# Patient Record
Sex: Male | Born: 1990 | Race: White | Hispanic: No | Marital: Single | Smoking: Current every day smoker
Health system: Southern US, Community
[De-identification: ages and names within clinical notes are randomized; demographics above are authoritative.]

## PROBLEM LIST (undated history)

## (undated) DIAGNOSIS — S56429A Laceration of extensor muscle, fascia and tendon of unspecified finger at forearm level, initial encounter: Secondary | ICD-10-CM

## (undated) DIAGNOSIS — L237 Allergic contact dermatitis due to plants, except food: Secondary | ICD-10-CM

## (undated) DIAGNOSIS — Z8614 Personal history of Methicillin resistant Staphylococcus aureus infection: Secondary | ICD-10-CM

## (undated) DIAGNOSIS — S61209A Unspecified open wound of unspecified finger without damage to nail, initial encounter: Secondary | ICD-10-CM

## (undated) HISTORY — PX: NO PAST SURGERIES: SHX2092

---

## 2012-05-20 DIAGNOSIS — Z8614 Personal history of Methicillin resistant Staphylococcus aureus infection: Secondary | ICD-10-CM

## 2012-05-20 HISTORY — DX: Personal history of Methicillin resistant Staphylococcus aureus infection: Z86.14

## 2013-11-01 ENCOUNTER — Encounter (HOSPITAL_COMMUNITY): Payer: Self-pay | Admitting: Emergency Medicine

## 2013-11-01 ENCOUNTER — Emergency Department (HOSPITAL_COMMUNITY)
Admission: EM | Admit: 2013-11-01 | Discharge: 2013-11-01 | Disposition: A | Discharge status: Home / Self Care | Payer: Self-pay | Attending: Emergency Medicine | Admitting: Emergency Medicine

## 2013-11-01 ENCOUNTER — Emergency Department (HOSPITAL_COMMUNITY): Payer: Self-pay

## 2013-11-01 DIAGNOSIS — F17200 Nicotine dependence, unspecified, uncomplicated: Secondary | ICD-10-CM | POA: Insufficient documentation

## 2013-11-01 DIAGNOSIS — S61012A Laceration without foreign body of left thumb without damage to nail, initial encounter: Secondary | ICD-10-CM

## 2013-11-01 DIAGNOSIS — S61209A Unspecified open wound of unspecified finger without damage to nail, initial encounter: Secondary | ICD-10-CM

## 2013-11-01 DIAGNOSIS — W261XXA Contact with sword or dagger, initial encounter: Secondary | ICD-10-CM

## 2013-11-01 DIAGNOSIS — Y9389 Activity, other specified: Secondary | ICD-10-CM | POA: Insufficient documentation

## 2013-11-01 DIAGNOSIS — Z880 Allergy status to penicillin: Secondary | ICD-10-CM | POA: Insufficient documentation

## 2013-11-01 DIAGNOSIS — W260XXA Contact with knife, initial encounter: Secondary | ICD-10-CM | POA: Insufficient documentation

## 2013-11-01 DIAGNOSIS — Z792 Long term (current) use of antibiotics: Secondary | ICD-10-CM | POA: Insufficient documentation

## 2013-11-01 DIAGNOSIS — Y929 Unspecified place or not applicable: Secondary | ICD-10-CM | POA: Insufficient documentation

## 2013-11-01 DIAGNOSIS — S56429A Laceration of extensor muscle, fascia and tendon of unspecified finger at forearm level, initial encounter: Secondary | ICD-10-CM

## 2013-11-01 DIAGNOSIS — Z23 Encounter for immunization: Secondary | ICD-10-CM | POA: Insufficient documentation

## 2013-11-01 HISTORY — DX: Laceration of extensor muscle, fascia and tendon of unspecified finger at forearm level, initial encounter: S56.429A

## 2013-11-01 HISTORY — DX: Unspecified open wound of unspecified finger without damage to nail, initial encounter: S61.209A

## 2013-11-01 MED ORDER — HYDROCODONE-ACETAMINOPHEN 5-325 MG PO TABS: 1.0000 | ORAL_TABLET | ORAL | Status: DC | PRN

## 2013-11-01 MED ORDER — OXYCODONE-ACETAMINOPHEN 5-325 MG PO TABS: 2.0000 | ORAL_TABLET | Freq: Once | ORAL | Status: DC

## 2013-11-01 MED ORDER — TETANUS-DIPHTH-ACELL PERTUSSIS 5-2.5-18.5 LF-MCG/0.5 IM SUSP
0.5000 mL | Freq: Once | INTRAMUSCULAR | Status: AC
Start: 2013-11-01 — End: 2013-11-01
  Administered 2013-11-01: 0.5 mL via INTRAMUSCULAR
  Filled 2013-11-01: qty 0.5

## 2013-11-01 MED ORDER — CLINDAMYCIN HCL 300 MG PO CAPS: 300.0000 mg | ORAL_CAPSULE | Freq: Three times a day (TID) | ORAL | Status: AC

## 2013-11-01 NOTE — Progress Notes (Signed)
Orthopedic Tech Progress Note Patient Details:  Jolene ProvostKasey Lerman 03/28/1991 [id number]  Ortho Devices Type of Ortho Device: Ace wrap;Thumb spica splint Ortho Device/Splint Location: lue Ortho Device/Splint Interventions: Application As ordered by PA Paulo FruitHannah Mutchersbaugh  Leonidus Rowand 11/01/2013, 8:59 PM

## 2013-11-01 NOTE — Discharge Instructions (Addendum)
Keep bandage on, clean and dry  Take your antibiotic medicine until gone (clindamycin) Follow-up as directed

## 2013-11-01 NOTE — ED Provider Notes (Signed)
CSN: [id number]     Arrival date & time 11/01/13  1814 History  This chart was scribed for non-physician practitioner, Dierdre ForthHannah Ryn Peine, PA-C working with Flint MelterElliott L Wentz, MD by Greggory StallionKayla Andersen, ED scribe. This patient was seen in room TR06C/TR06C and the patient's care was started at 7:25 PM.   Chief Complaint  Patient presents with  . Laceration    Laceration to the finger, the left thumb.  Bleeding is controlled.   The history is provided by the patient. No language interpreter was used.   HPI Comments: Kevin Gilmore is a 23 y.o. male who presents to the Emergency Department complaining of a laceration to his left thumb that occurred around 5:45 PM today. States he was making a fishing pole out of bamboo with a kitchen knife and accidentally cut his left thumb. Pt has moderate pain around the area that is worsened with certain movements. He has taken two hydrocodone pills prior to arrival with little relief. Pt is unsure of when his last tetanus was.   History reviewed. No pertinent past medical history. History reviewed. No pertinent past surgical history. History reviewed. No pertinent family history. History  Substance Use Topics  . Smoking status: Current Every Day Smoker -- 0.50 packs/day    Types: Cigarettes  . Smokeless tobacco: Never Used  . Alcohol Use: Yes     Comment: ocassionally    Review of Systems  Musculoskeletal: Positive for myalgias.  Skin: Positive for wound.  All other systems reviewed and are negative.  Allergies  Penicillins  Home Medications   Prior to Admission medications   Medication Sig Start Date End Date Taking? Authorizing Provider  clindamycin (CLEOCIN) 300 MG capsule Take 1 capsule (300 mg total) by mouth 3 (three) times daily. 11/01/13   Jodi Marbleavid A Thompson, MD  HYDROcodone-acetaminophen (NORCO) 5-325 MG per tablet Take 1-2 tablets by mouth every 4 (four) hours as needed. 11/01/13   Jodi Marbleavid A Thompson, MD   BP 116/63  Pulse 59  Temp(Src) 98.3 F  (36.8 C) (Oral)  Resp 18  SpO2 99%  Physical Exam  Nursing note and vitals reviewed. Constitutional: He is oriented to person, place, and time. He appears well-developed and well-nourished. No distress.  HENT:  Head: Normocephalic and atraumatic.  Eyes: Conjunctivae are normal. No scleral icterus.  Neck: Normal range of motion.  Cardiovascular: Normal rate, regular rhythm, normal heart sounds and intact distal pulses.   No murmur heard. Capillary refill < 3 sec  Pulmonary/Chest: Effort normal and breath sounds normal. No respiratory distress.  Musculoskeletal: Normal range of motion. He exhibits no edema.  ROM: Full flexion of left thumb. Extension of MCP and PIP but no extension of DIP - evidence of extensor tendon rupture  Neurological: He is alert and oriented to person, place, and time.  Sensation: intact Strength: 3/5 at MCP due to pain  Skin: Skin is warm and dry. He is not diaphoretic. No erythema.  2 cm deep laceration to the dorsum of left thumb with visible fascia  Psychiatric: He has a normal mood and affect.   ED Course  Procedures (including critical care time)  DIAGNOSTIC STUDIES: Oxygen Saturation is 98% on RA, normal by my interpretation.   COORDINATION OF CARE: 7:31 PM-Discussed treatment plan which includes updating tetanus and consulting hand surgery with pt at bedside and pt agreed to plan.   7:50 PM-Dr. Janee Mornhompson evaluated pt. Advised placing 2 sutures and have pt follow up in the office in 4 days.  LACERATION REPAIR  PROCEDURE NOTE The patient's identification was confirmed and consent was obtained. This procedure was performed by Newman NipAllen Benda, PA-Student at 8:05 PM. Authorized by: Dierdre ForthHannah Tahlia Deamer, PA-C Site: dorsum of left thumb Sterile procedures observed Anesthetic used (type and amt): 3 mL 2% lidocaine without epi Suture type/size: 5-0 Prolene Length: 2 cm # of Sutures: 2 Technique: simple interrupted Complexity: simple Antibx ointment  applied Tetanus ordered Site anesthetized, irrigated with NS, explored without evidence of foreign body, wound well approximated, site covered with dry, sterile dressing.  Patient tolerated procedure well without complications. Instructions for care discussed verbally and patient provided with additional written instructions for homecare and f/u.  Labs Review Labs Reviewed - No data to display  Imaging Review Dg Finger Thumb Left  11/01/2013   CLINICAL DATA:  Left thumb laceration.  EXAM: LEFT THUMB 2+V  COMPARISON:  None.  FINDINGS: Soft tissue defect near the base of the left thumb. No underlying bony abnormality. No fracture, subluxation or dislocation. No radiopaque foreign bodies. Joint spaces are maintained.  IMPRESSION: No acute bony abnormality.   Electronically Signed   By: Charlett NoseKevin  Dover M.D.   On: 11/01/2013 19:50     EKG Interpretation None      MDM   Final diagnoses:  Laceration of left thumb with tendon involvement   Kevin Gilmore presents with laceration to the left thumb.  Evidence of extensor tendon disruption on exam. Will consult hand and update tetanus shot.  Discussed with Dr. Janee Mornhompson of hand surgery.  He recommends skin closure of the wound and he will follow in the clinic and repair under anesthesia later date.  Patient without diabetes or reason to have delayed healing.  He does report that he is taking oral steroids for him poison oak.  Tdap booster given. Pressure irrigation performed. Laceration occurred < 8 hours prior to repair which was well tolerated. Pt has no co morbidities to effect normal wound healing.  Patient placed in a thumb spica for stabilization. He's to return to the emergency room for fevers, swelling or increased pain.  He is to take his antibiotics as directed.  He is to see Dr. Janee Mornhompson as directed.  BP 116/63  Pulse 59  Temp(Src) 98.3 F (36.8 C) (Oral)  Resp 18  SpO2 99%   I personally performed the services described in this  documentation, which was scribed in my presence. The recorded information has been reviewed and is accurate.  Dahlia ClientHannah Sandee Bernath, PA-C 11/01/13 2100

## 2013-11-01 NOTE — Consult Note (Signed)
ORTHOPAEDIC CONSULTATION HISTORY & PHYSICAL REQUESTING PHYSICIAN: Flint MelterElliott L Wentz, MD  Chief Complaint: Left thumb laceration  HPI: Kevin Gilmore is a 23 y.o. male who injured the dorsum of his left thumb with a kitchen knife, attempting to fashion bamboo into a fishing pole. His tetanus has been updated. 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  History reviewed. No pertinent past medical history. History reviewed. No pertinent past surgical history. History   Social History  . Marital Status: Single    Spouse Name: N/A    Number of Children: N/A  . Years of Education: N/A   Social History Main Topics  . Smoking status: Current Every Day Smoker -- 0.50 packs/day    Types: Cigarettes  . Smokeless tobacco: Never Used  . Alcohol Use: Yes     Comment: ocassionally  . Drug Use: No  . Sexual Activity: None   Other Topics Concern  . None   Social History Narrative  . None   History reviewed. No pertinent family history. Allergies  Allergen Reactions  . Penicillins    Prior to Admission medications   Medication Sig Start Date End Date Taking? Authorizing Provider  clindamycin (CLEOCIN) 300 MG capsule Take 1 capsule (300 mg total) by mouth 3 (three) times daily. 11/01/13   Jodi Marbleavid A Donnamae Muilenburg, MD  HYDROcodone-acetaminophen (NORCO) 5-325 MG per tablet Take 1-2 tablets by mouth every 4 (four) hours as needed. 11/01/13   Jodi Marbleavid A Aslyn Cottman, MD   Dg Finger Thumb Left  11/01/2013   CLINICAL DATA:  Left thumb laceration.  EXAM: LEFT THUMB 2+V  COMPARISON:  None.  FINDINGS: Soft tissue defect near the base of the left thumb. No underlying bony abnormality. No fracture, subluxation or dislocation. No radiopaque foreign bodies. Joint spaces are maintained.  IMPRESSION: No acute bony abnormality.   Electronically Signed   By: Charlett NoseKevin  Dover M.D.   On: 11/01/2013 19:50    Positive ROS: All other systems have been reviewed and were otherwise negative with the exception of those mentioned in the HPI and as  above.  Physical Exam: Vitals: Refer to EMR. Constitutional:  WD, WN, NAD HEENT:  NCAT, EOMI Neuro/Psych:  Alert & oriented to person, place, and time; appropriate mood & affect Lymphatic: No generalized extremity edema or lymphadenopathy Extremities / MSK:  The extremities are normal with respect to appearance, ranges of motion, joint stability, muscle strength/tone, sensation, & perfusion except as otherwise noted:  Left thumb with a mostly transverse laceration over the dorsal radial aspect of T1, with visible distal tendon stump, no visible proximal stump. He has no active extension of the IP joint, but he can extend the MP joint and he can lift the thumb from a flat position on the table into the air.  Assessment: Left thumb extensor tendon laceration  Plan: I discussed with the ER provider a plan that included water to closing sutures of the laceration of the skin. 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 The patient desires not to undergo general anesthesia, and this operation could be performed successfully with an axillary block. [facility]  Questions were invited and answered, and consent obtained. He'll be discharged with a short course of clindamycin.  Jerren Flinchbaugh A. Carnita Golob, MD      Orthopaedic & Hand Surgery [facility] [address] [place], [place]  [address] Office: [phone] Mobile: [phone]  

## 2013-11-01 NOTE — ED Notes (Signed)
Discharge instructions reviewed with pt. Pt verbalized understanding.   

## 2013-11-01 NOTE — ED Notes (Signed)
Laceration to the finger, the left thumb.  Bleeding is controlled.  The patient's pain is 6/10.  He was making a fishing pole out of bamboo and he accidentally cut his left thumb.

## 2013-11-02 ENCOUNTER — Other Ambulatory Visit: Payer: Self-pay | Admitting: Orthopedic Surgery

## 2013-11-02 ENCOUNTER — Encounter (HOSPITAL_BASED_OUTPATIENT_CLINIC_OR_DEPARTMENT_OTHER): Payer: Self-pay | Admitting: *Deleted

## 2013-11-02 NOTE — ED Provider Notes (Signed)
Medical screening examination/treatment/procedure(s) were performed by non-physician practitioner and as supervising physician I was immediately available for consultation/collaboration.   EKG Interpretation None       Maggy Wyble L Garon Melander, MD 11/02/13 1225 

## 2013-11-05 ENCOUNTER — Encounter (HOSPITAL_BASED_OUTPATIENT_CLINIC_OR_DEPARTMENT_OTHER): Payer: Self-pay | Admitting: Anesthesiology

## 2013-11-05 ENCOUNTER — Ambulatory Visit (HOSPITAL_BASED_OUTPATIENT_CLINIC_OR_DEPARTMENT_OTHER)
Admission: RE | Admit: 2013-11-05 | Discharge: 2013-11-05 | Disposition: A | Discharge status: Home / Self Care | Payer: Self-pay | Source: Ambulatory Visit | Attending: Orthopedic Surgery | Admitting: Orthopedic Surgery

## 2013-11-05 ENCOUNTER — Encounter (HOSPITAL_BASED_OUTPATIENT_CLINIC_OR_DEPARTMENT_OTHER)
Admission: RE | Disposition: A | Discharge status: Home / Self Care | Payer: Self-pay | Source: Ambulatory Visit | Attending: Orthopedic Surgery

## 2013-11-05 ENCOUNTER — Ambulatory Visit (HOSPITAL_BASED_OUTPATIENT_CLINIC_OR_DEPARTMENT_OTHER): Payer: Self-pay | Admitting: Anesthesiology

## 2013-11-05 DIAGNOSIS — W261XXA Contact with sword or dagger, initial encounter: Secondary | ICD-10-CM

## 2013-11-05 DIAGNOSIS — W260XXA Contact with knife, initial encounter: Secondary | ICD-10-CM | POA: Insufficient documentation

## 2013-11-05 DIAGNOSIS — S61409A Unspecified open wound of unspecified hand, initial encounter: Secondary | ICD-10-CM | POA: Insufficient documentation

## 2013-11-05 DIAGNOSIS — F17200 Nicotine dependence, unspecified, uncomplicated: Secondary | ICD-10-CM | POA: Insufficient documentation

## 2013-11-05 DIAGNOSIS — S66909A Unspecified injury of unspecified muscle, fascia and tendon at wrist and hand level, unspecified hand, initial encounter: Principal | ICD-10-CM

## 2013-11-05 HISTORY — DX: Allergic contact dermatitis due to plants, except food: L23.7

## 2013-11-05 HISTORY — PX: TENDON REPAIR: SHX5111

## 2013-11-05 HISTORY — DX: Laceration of extensor muscle, fascia and tendon of unspecified finger at forearm level, initial encounter: S56.429A

## 2013-11-05 HISTORY — DX: Unspecified open wound of unspecified finger without damage to nail, initial encounter: S61.209A

## 2013-11-05 HISTORY — DX: Personal history of Methicillin resistant Staphylococcus aureus infection: Z86.14

## 2013-11-05 LAB — POCT HEMOGLOBIN-HEMACUE: Hemoglobin: 17.7 g/dL — ABNORMAL HIGH (ref 13.0–17.0)

## 2013-11-05 SURGERY — TENDON REPAIR
Anesthesia: Regional | Site: Thumb | Laterality: Left

## 2013-11-05 MED ORDER — CHLORHEXIDINE GLUCONATE 4 % EX LIQD: 60.0000 mL | Freq: Once | CUTANEOUS | Status: DC

## 2013-11-05 MED ORDER — FENTANYL CITRATE 0.05 MG/ML IJ SOLN
INTRAMUSCULAR | Status: DC | PRN
  Administered 2013-11-05: 50 ug via INTRAVENOUS

## 2013-11-05 MED ORDER — LACTATED RINGERS IV SOLN
INTRAVENOUS | Status: DC
  Administered 2013-11-05: 14:00:00 via INTRAVENOUS

## 2013-11-05 MED ORDER — FENTANYL CITRATE 0.05 MG/ML IJ SOLN
INTRAMUSCULAR | Status: AC
  Filled 2013-11-05: qty 2

## 2013-11-05 MED ORDER — PROPOFOL INFUSION 10 MG/ML OPTIME
INTRAVENOUS | Status: DC | PRN
  Administered 2013-11-05: 200 ug/kg/min via INTRAVENOUS

## 2013-11-05 MED ORDER — OXYCODONE HCL 5 MG PO TABS
5.0000 mg | ORAL_TABLET | Freq: Once | ORAL | Status: AC | PRN
  Administered 2013-11-05: 5 mg via ORAL

## 2013-11-05 MED ORDER — OXYCODONE HCL 5 MG/5ML PO SOLN: 5.0000 mg | Freq: Once | ORAL | Status: AC | PRN

## 2013-11-05 MED ORDER — FENTANYL CITRATE 0.05 MG/ML IJ SOLN
50.0000 ug | INTRAMUSCULAR | Status: DC | PRN
  Administered 2013-11-05: 100 ug via INTRAVENOUS

## 2013-11-05 MED ORDER — FENTANYL CITRATE 0.05 MG/ML IJ SOLN
INTRAMUSCULAR | Status: AC
  Filled 2013-11-05: qty 4

## 2013-11-05 MED ORDER — LIDOCAINE HCL (CARDIAC) 20 MG/ML IV SOLN
INTRAVENOUS | Status: DC | PRN
  Administered 2013-11-05: 60 mg via INTRAVENOUS

## 2013-11-05 MED ORDER — HYDROCODONE-ACETAMINOPHEN 5-325 MG PO TABS: 1.0000 | ORAL_TABLET | ORAL | Status: AC | PRN

## 2013-11-05 MED ORDER — BUPIVACAINE-EPINEPHRINE (PF) 0.5% -1:200000 IJ SOLN
INTRAMUSCULAR | Status: AC
  Filled 2013-11-05: qty 30

## 2013-11-05 MED ORDER — HYDROMORPHONE HCL PF 1 MG/ML IJ SOLN
INTRAMUSCULAR | Status: AC
  Filled 2013-11-05: qty 1

## 2013-11-05 MED ORDER — HYDROMORPHONE HCL PF 1 MG/ML IJ SOLN
0.2500 mg | INTRAMUSCULAR | Status: DC | PRN
  Administered 2013-11-05 (×2): 0.5 mg via INTRAVENOUS

## 2013-11-05 MED ORDER — ONDANSETRON HCL 4 MG/2ML IJ SOLN
INTRAMUSCULAR | Status: DC | PRN
  Administered 2013-11-05: 4 mg via INTRAVENOUS

## 2013-11-05 MED ORDER — DEXAMETHASONE SODIUM PHOSPHATE 10 MG/ML IJ SOLN
INTRAMUSCULAR | Status: DC | PRN
  Administered 2013-11-05: 10 mg via INTRAVENOUS

## 2013-11-05 MED ORDER — MIDAZOLAM HCL 2 MG/2ML IJ SOLN
1.0000 mg | INTRAMUSCULAR | Status: DC | PRN
  Administered 2013-11-05: 2 mg via INTRAVENOUS

## 2013-11-05 MED ORDER — MEPERIDINE HCL 25 MG/ML IJ SOLN: 6.2500 mg | INTRAMUSCULAR | Status: DC | PRN

## 2013-11-05 MED ORDER — BUPIVACAINE HCL (PF) 0.5 % IJ SOLN
INTRAMUSCULAR | Status: AC
  Filled 2013-11-05: qty 30

## 2013-11-05 MED ORDER — MIDAZOLAM HCL 2 MG/ML PO SYRP: 12.0000 mg | ORAL_SOLUTION | Freq: Once | ORAL | Status: DC | PRN

## 2013-11-05 MED ORDER — PROPOFOL 10 MG/ML IV BOLUS
INTRAVENOUS | Status: DC | PRN
  Administered 2013-11-05: 200 mg via INTRAVENOUS

## 2013-11-05 MED ORDER — OXYCODONE HCL 5 MG PO TABS
ORAL_TABLET | ORAL | Status: AC
  Filled 2013-11-05: qty 1

## 2013-11-05 MED ORDER — MIDAZOLAM HCL 2 MG/2ML IJ SOLN
INTRAMUSCULAR | Status: AC
  Filled 2013-11-05: qty 2

## 2013-11-05 MED ORDER — ONDANSETRON HCL 4 MG/2ML IJ SOLN: 4.0000 mg | Freq: Once | INTRAMUSCULAR | Status: DC | PRN

## 2013-11-05 MED ORDER — CLINDAMYCIN PHOSPHATE 900 MG/50ML IV SOLN
900.0000 mg | INTRAVENOUS | Status: AC
  Administered 2013-11-05: 900 mg via INTRAVENOUS

## 2013-11-05 MED ORDER — BUPIVACAINE-EPINEPHRINE (PF) 0.5% -1:200000 IJ SOLN
INTRAMUSCULAR | Status: DC | PRN
  Administered 2013-11-05: 30 mL via PERINEURAL

## 2013-11-05 MED ORDER — CLINDAMYCIN PHOSPHATE 900 MG/50ML IV SOLN
INTRAVENOUS | Status: AC
  Filled 2013-11-05: qty 50

## 2013-11-05 SURGICAL SUPPLY — 68 items
BLADE MINI RND TIP GREEN BEAV (BLADE) IMPLANT
BLADE SURG 15 STRL LF DISP TIS (BLADE) ×1 IMPLANT
BLADE SURG 15 STRL SS (BLADE) ×2
BNDG COHESIVE 3X5 TAN STRL LF (GAUZE/BANDAGES/DRESSINGS) ×15 IMPLANT
BNDG ESMARK 4X9 LF (GAUZE/BANDAGES/DRESSINGS) ×3 IMPLANT
BNDG GAUZE ELAST 4 BULKY (GAUZE/BANDAGES/DRESSINGS) ×6 IMPLANT
CHLORAPREP W/TINT 26ML (MISCELLANEOUS) ×3 IMPLANT
CORDS BIPOLAR (ELECTRODE) ×3 IMPLANT
COVER MAYO STAND STRL (DRAPES) ×3 IMPLANT
COVER TABLE BACK 60X90 (DRAPES) ×3 IMPLANT
CUFF TOURNIQUET SINGLE 18IN (TOURNIQUET CUFF) ×3 IMPLANT
DECANTER SPIKE VIAL GLASS SM (MISCELLANEOUS) IMPLANT
DRAPE EXTREMITY T 121X128X90 (DRAPE) ×3 IMPLANT
DRAPE SURG 17X23 STRL (DRAPES) ×3 IMPLANT
DRSG EMULSION OIL 3X3 NADH (GAUZE/BANDAGES/DRESSINGS) ×3 IMPLANT
ELECT REM PT RETURN 9FT ADLT (ELECTROSURGICAL)
ELECTRODE REM PT RTRN 9FT ADLT (ELECTROSURGICAL) IMPLANT
GAUZE SPONGE 4X4 12PLY STRL (GAUZE/BANDAGES/DRESSINGS) ×3 IMPLANT
GLOVE BIO SURGEON STRL SZ7 (GLOVE) ×3 IMPLANT
GLOVE BIO SURGEON STRL SZ7.5 (GLOVE) ×3 IMPLANT
GLOVE BIOGEL M 7.0 STRL (GLOVE) ×3 IMPLANT
GLOVE BIOGEL PI IND STRL 7.5 (GLOVE) ×1 IMPLANT
GLOVE BIOGEL PI IND STRL 8 (GLOVE) ×1 IMPLANT
GLOVE BIOGEL PI INDICATOR 7.5 (GLOVE) ×2
GLOVE BIOGEL PI INDICATOR 8 (GLOVE) ×2
GLOVE ECLIPSE 6.5 STRL STRAW (GLOVE) ×3 IMPLANT
GOWN STRL REUS W/ TWL LRG LVL3 (GOWN DISPOSABLE) ×2 IMPLANT
GOWN STRL REUS W/TWL LRG LVL3 (GOWN DISPOSABLE) ×4
LOOP VESSEL MAXI BLUE (MISCELLANEOUS) IMPLANT
LOOP VESSEL MINI RED (MISCELLANEOUS) IMPLANT
NEEDLE HYPO 25X1 1.5 SAFETY (NEEDLE) IMPLANT
NS IRRIG 1000ML POUR BTL (IV SOLUTION) ×3 IMPLANT
PACK BASIN DAY SURGERY FS (CUSTOM PROCEDURE TRAY) ×3 IMPLANT
PADDING CAST ABS 3INX4YD NS (CAST SUPPLIES)
PADDING CAST ABS 4INX4YD NS (CAST SUPPLIES) ×2
PADDING CAST ABS COTTON 3X4 (CAST SUPPLIES) IMPLANT
PADDING CAST ABS COTTON 4X4 ST (CAST SUPPLIES) ×1 IMPLANT
PENCIL BUTTON HOLSTER BLD 10FT (ELECTRODE) IMPLANT
RUBBERBAND STERILE (MISCELLANEOUS) IMPLANT
SLEEVE SCD COMPRESS KNEE MED (MISCELLANEOUS) IMPLANT
SLING ARM LRG ADULT FOAM STRAP (SOFTGOODS) IMPLANT
SPEAR EYE SURG WECK-CEL (MISCELLANEOUS) ×3 IMPLANT
SPLINT FAST PLASTER 5X30 (CAST SUPPLIES)
SPLINT PLASTER CAST FAST 5X30 (CAST SUPPLIES) IMPLANT
SPLINT PLASTER CAST XFAST 3X15 (CAST SUPPLIES) IMPLANT
SPLINT PLASTER XTRA FASTSET 3X (CAST SUPPLIES)
STOCKINETTE 4X48 STRL (DRAPES) ×3 IMPLANT
SUT ETHIBOND 3-0 V-5 (SUTURE) ×3 IMPLANT
SUT ETHILON 8 0 BV130 4 (SUTURE) IMPLANT
SUT ETHILON 9 0 V 100.4 (SUTURE) ×3 IMPLANT
SUT FIBERWIRE 2-0 18 17.9 3/8 (SUTURE)
SUT MERSILENE 4 0 P 3 (SUTURE) IMPLANT
SUT NYLON 9 0 VRM6 (SUTURE) IMPLANT
SUT PROLENE 6 0 P 1 18 (SUTURE) ×3 IMPLANT
SUT SILK 4 0 PS 2 (SUTURE) IMPLANT
SUT STEEL 4 (SUTURE) ×3 IMPLANT
SUT SUPRAMID 3-0 (SUTURE) ×6 IMPLANT
SUT SUPRAMID 4-0 (SUTURE) IMPLANT
SUT VICRYL 3-0 CR8 SH (SUTURE) IMPLANT
SUT VICRYL RAPIDE 4-0 (SUTURE) IMPLANT
SUT VICRYL RAPIDE 4/0 PS 2 (SUTURE) ×3 IMPLANT
SUTURE FIBERWR 2-0 18 17.9 3/8 (SUTURE) IMPLANT
SYR BULB 3OZ (MISCELLANEOUS) ×3 IMPLANT
SYRINGE 10CC LL (SYRINGE) IMPLANT
TOWEL OR 17X24 6PK STRL BLUE (TOWEL DISPOSABLE) ×3 IMPLANT
TUBE CONNECTING 20'X1/4 (TUBING)
TUBE CONNECTING 20X1/4 (TUBING) IMPLANT
UNDERPAD 30X30 INCONTINENT (UNDERPADS AND DIAPERS) ×3 IMPLANT

## 2013-11-05 NOTE — Anesthesia Postprocedure Evaluation (Signed)
Anesthesia Post Note  Patient: Kevin Gilmore  Procedure(s) Performed: Procedure(s) (LRB): LEFT THUMB TENDON REPAIR (Left)  Anesthesia type: general  Patient location: PACU  Post pain: Pain level controlled  Post assessment: Patient's Cardiovascular Status Stable  Last Vitals:  Filed Vitals:   11/05/13 1615  BP: 110/59  Pulse: 68  Temp:   Resp: 18    Post vital signs: Reviewed and stable  Level of consciousness: sedated  Complications: No apparent anesthesia complications

## 2013-11-05 NOTE — Transfer of Care (Signed)
Immediate Anesthesia Transfer of Care Note  Patient: Kevin ProvostKasey Chipps  Procedure(s) Performed: Procedure(s) with comments: LEFT THUMB TENDON REPAIR (Left) - ANESTHESIA: GENERAL/BLOCK  Patient Location: PACU  Anesthesia Type:General  Level of Consciousness: awake and sedated  Airway & Oxygen Therapy: Patient Spontanous Breathing and Patient connected to face mask oxygen  Post-op Assessment: Report given to PACU RN and Post -op Vital signs reviewed and stable  Post vital signs: Reviewed and stable  Complications: No apparent anesthesia complications

## 2013-11-05 NOTE — Progress Notes (Signed)
Assisted Dr. Ossey with left, ultrasound guided, supraclavicular block. Side rails up, monitors on throughout procedure. See vital signs in flow sheet. Tolerated Procedure well. 

## 2013-11-05 NOTE — Anesthesia Procedure Notes (Addendum)
Anesthesia Regional Block:  Supraclavicular block  Pre-Anesthetic Checklist: ,, timeout performed, Correct Patient, Correct Site, Correct Laterality, Correct Procedure, Correct Position, site marked, Risks and benefits discussed,  Surgical consent,  Pre-op evaluation,  At surgeon's request and post-op pain management  Laterality: Left  Prep: chloraprep       Needles:  Injection technique: Single-shot  Needle Type: Echogenic Stimulator Needle     Needle Length: 9cm 9 cm Needle Gauge: 21 and 21 G    Additional Needles:  Procedures: ultrasound guided (picture in chart) and nerve stimulator Supraclavicular block  Nerve Stimulator or Paresthesia:  Response: 0.4 mA,   Additional Responses:   Narrative:  Start time: 11/05/2013 1:45 PM End time: 11/05/2013 2:00 PM Injection made incrementally with aspirations every 5 mL.  Performed by: Personally  Anesthesiologist: Arta BruceKevin Ossey MD  Additional Notes: Monitors applied. Patient sedated. Sterile prep and drape,hand hygiene and sterile gloves were used. Relevant anatomy identified.Needle position confirmed.Local anesthetic injected incrementally after negative aspiration. Local anesthetic spread visualized around nerve(s). Vascular puncture avoided. No complications. Image printed for medical record.The patient tolerated the procedure well.        Procedure Name: LMA Insertion Performed by: York GricePEARSON, DONNA W Pre-anesthesia Checklist: Patient identified, Timeout performed, Emergency Drugs available, Suction available and Patient being monitored    Procedure Name: LMA Insertion Performed by: York GricePEARSON, DONNA W Pre-anesthesia Checklist: Patient identified, Timeout performed, Emergency Drugs available, Suction available and Patient being monitored Patient Re-evaluated:Patient Re-evaluated prior to inductionOxygen Delivery Method: Circle system utilized Preoxygenation: Pre-oxygenation with 100% oxygen Intubation Type: IV  induction Ventilation: Mask ventilation without difficulty LMA: LMA inserted LMA Size: 4.0 Tube type: Oral Number of attempts: 1 Placement Confirmation: breath sounds checked- equal and bilateral and positive ETCO2 Tube secured with: Tape Dental Injury: Teeth and Oropharynx as per pre-operative assessment

## 2013-11-05 NOTE — H&P (View-Only) (Signed)
ORTHOPAEDIC CONSULTATION HISTORY & PHYSICAL REQUESTING PHYSICIAN: Flint MelterElliott L Wentz, MD  Chief Complaint: Left thumb laceration  HPI: Kevin Gilmore is a 23 y.o. male who injured the dorsum of his left thumb with a kitchen knife, attempting to fashion bamboo into a fishing pole. His tetanus has been updated. 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  History reviewed. No pertinent past medical history. History reviewed. No pertinent past surgical history. History   Social History  . Marital Status: Single    Spouse Name: N/A    Number of Children: N/A  . Years of Education: N/A   Social History Main Topics  . Smoking status: Current Every Day Smoker -- 0.50 packs/day    Types: Cigarettes  . Smokeless tobacco: Never Used  . Alcohol Use: Yes     Comment: ocassionally  . Drug Use: No  . Sexual Activity: None   Other Topics Concern  . None   Social History Narrative  . None   History reviewed. No pertinent family history. Allergies  Allergen Reactions  . Penicillins    Prior to Admission medications   Medication Sig Start Date End Date Taking? Authorizing Provider  clindamycin (CLEOCIN) 300 MG capsule Take 1 capsule (300 mg total) by mouth 3 (three) times daily. 11/01/13   Jodi Marbleavid A Nomi Rudnicki, MD  HYDROcodone-acetaminophen (NORCO) 5-325 MG per tablet Take 1-2 tablets by mouth every 4 (four) hours as needed. 11/01/13   Jodi Marbleavid A Anjeli Casad, MD   Dg Finger Thumb Left  11/01/2013   CLINICAL DATA:  Left thumb laceration.  EXAM: LEFT THUMB 2+V  COMPARISON:  None.  FINDINGS: Soft tissue defect near the base of the left thumb. No underlying bony abnormality. No fracture, subluxation or dislocation. No radiopaque foreign bodies. Joint spaces are maintained.  IMPRESSION: No acute bony abnormality.   Electronically Signed   By: Charlett NoseKevin  Dover M.D.   On: 11/01/2013 19:50    Positive ROS: All other systems have been reviewed and were otherwise negative with the exception of those mentioned in the HPI and as  above.  Physical Exam: Vitals: Refer to EMR. Constitutional:  WD, WN, NAD HEENT:  NCAT, EOMI Neuro/Psych:  Alert & oriented to person, place, and time; appropriate mood & affect Lymphatic: No generalized extremity edema or lymphadenopathy Extremities / MSK:  The extremities are normal with respect to appearance, ranges of motion, joint stability, muscle strength/tone, sensation, & perfusion except as otherwise noted:  Left thumb with a mostly transverse laceration over the dorsal radial aspect of T1, with visible distal tendon stump, no visible proximal stump. He has no active extension of the IP joint, but he can extend the MP joint and he can lift the thumb from a flat position on the table into the air.  Assessment: Left thumb extensor tendon laceration  Plan: I discussed with the ER provider a plan that included water to closing sutures of the laceration of the skin. 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 The patient desires not to undergo general anesthesia, and this operation could be performed successfully with an axillary block. [facility]  Questions were invited and answered, and consent obtained. He'll be discharged with a short course of clindamycin.  Kevin Astersavid A. Janee Mornhompson, MD      Orthopaedic & Hand Surgery [facility] [address] [place], [place]  [id number] Office: [phone] Mobile: [phone]

## 2013-11-05 NOTE — Op Note (Signed)
11/05/2013  2:25 PM  PATIENT:  Kevin Gilmore  [age] y.o. male  PRE-OPERATIVE DIAGNOSIS:  Left thumb extensor tendon laceration, zone 2  POST-OPERATIVE DIAGNOSIS:  Same  PROCEDURE:  Left thumb extensor tendon repair, zone 2  SURGEON: Cliffton Astersavid A. Janee Mornhompson, MD  PHYSICIAN ASSISTANT: None  ANESTHESIA:  general  SPECIMENS:  None  DRAINS:   None  PREOPERATIVE INDICATIONS:  Kevin Gilmore is a  23 y.o. male with left thumb extensor tendon laceration in zone 2  The risks benefits and alternatives were discussed with the patient preoperatively including but not limited to the risks of infection, bleeding, nerve injury, cardiopulmonary complications, the need for revision surgery, among others, and the patient verbalized understanding and consented to proceed.  OPERATIVE IMPLANTS: None  OPERATIVE PROCEDURE:  After receiving prophylactic antibiotics and a regional block, the patient was escorted to the operative theatre and placed in a supine position. General anesthesia was administered at the direction of the attending anesthesiologist Dr. Michelle Piperssey.   A surgical "time-out" was performed during which the planned procedure, proposed operative site, and the correct patient identity were compared to the operative consent and agreement confirmed by the circulating nurse according to current facility policy.  Following application of a tourniquet to the operative extremity, the exposed skin was pre-scrub with Hibiclens scrub brush before being formally prepped with Chloraprep and draped in the usual sterile fashion.  The limb was exsanguinated with an Esmarch bandage and the tourniquet inflated to approximately 100mmHg higher than systolic BP.  The laceration sutures are removed. It was extended proximally to create a proximal flap. The wound is irrigated. The proximal stump was found to be retracted about a centimeter and half. The tendon was repaired with 3-0 Supramid looped suture  in the method described by  Nedra HaiLee, resulting in a 4 strand core repair, followed by a running 6-0 Prolene locked epitendinous suture. The wound was again irrigated and the tourniquet deflated. Additional hemostasis wasn't necessary and the skin was closed with 4-0 Vicryl Rapide interrupted sutures. A bulky hand splint dressing containing plaster was applied with the wrist slightly extended and the thumb extended. He was awakened and taken to the recovery room stable condition, breathing spontaneously  DISPOSITION: He'll be discharged home today and our office will call him to arrange followup

## 2013-11-05 NOTE — Interval H&P Note (Signed)
History and Physical Interval Note:  11/05/2013 2:23 PM  Kevin ProvostKasey Gilmore  has presented today for surgery, with the diagnosis of LEFT THUMB EXTENSOR TENDON LACERATION  The various methods of treatment have been discussed with the patient and family. After consideration of risks, benefits and other options for treatment, the patient has consented to  Procedure(s) with comments: LEFT THUMB TENDON REPAIR (Left) - ANESTHESIA: GENERAL/BLOCK as a surgical intervention .  The patient's history has been reviewed, patient examined, no change in status, stable for surgery.  I have reviewed the patient's chart and labs.  Questions were answered to the patient's satisfaction.     THOMPSON, DAVID A.

## 2013-11-05 NOTE — Discharge Instructions (Signed)
Discharge Instructions ° ° °You have a dressing with a plaster splint incorporated in it. °Move your fingers as much as possible, making a full fist and fully opening the fist. °Elevate your hand to reduce pain & swelling of the digits.  Ice over the operative site may be helpful to reduce pain & swelling.  DO NOT USE HEAT. °Pain medicine has been prescribed for you.  °Use your medicine as needed over the first 48 hours, and then you can begin to taper your use.  You may use Tylenol in place of your prescribed pain medication, but not IN ADDITION to it. °Leave the dressing in place until you return to our office.  °You may shower, but keep the bandage clean & dry.  °You may drive a car when you are off of prescription pain medications and can safely control your vehicle with both hands. °Our office will call you to arrange follow-up ° ° °Please call [phone] during normal business hours or [phone] after hours for any problems. Including the following: ° °- excessive redness of the incisions °- drainage for more than 4 days °- fever of more than 101.5 F ° °*Please note that pain medications will not be refilled after hours or on weekends. ° ° °Post Anesthesia Home Care Instructions ° °Activity: °Get plenty of rest for the remainder of the day. A responsible adult should stay with you for 24 hours following the procedure.  °For the next 24 hours, DO NOT: °-Drive a car °-Operate machinery °-Drink alcoholic beverages °-Take any medication unless instructed by your physician °-Make any legal decisions or sign important papers. ° °Meals: °Start with liquid foods such as gelatin or soup. Progress to regular foods as tolerated. Avoid greasy, spicy, heavy foods. If nausea and/or vomiting occur, drink only clear liquids until the nausea and/or vomiting subsides. Call your physician if vomiting continues. ° °Special Instructions/Symptoms: °Your throat may feel dry or sore from the anesthesia or the breathing tube  placed in your throat during surgery. If this causes discomfort, gargle with warm salt water. The discomfort should disappear within 24 hours. ° °

## 2013-11-05 NOTE — Anesthesia Preprocedure Evaluation (Signed)
Anesthesia Evaluation  Patient identified by MRN, date of birth, ID band Patient awake    Reviewed: Allergy & Precautions, H&P , NPO status , Patient's Chart, lab work & pertinent test results  Airway Mallampati: I TM Distance: >3 FB Neck ROM: Full    Dental   Pulmonary Current Smoker,          Cardiovascular     Neuro/Psych    GI/Hepatic   Endo/Other    Renal/GU      Musculoskeletal   Abdominal   Peds  Hematology   Anesthesia Other Findings   Reproductive/Obstetrics                           Anesthesia Physical Anesthesia Plan  ASA: I  Anesthesia Plan: Regional   Post-op Pain Management:    Induction: Intravenous  Airway Management Planned: Simple Face Mask  Additional Equipment:   Intra-op Plan:   Post-operative Plan:   Informed Consent: I have reviewed the patients History and Physical, chart, labs and discussed the procedure including the risks, benefits and alternatives for the proposed anesthesia with the patient or authorized representative who has indicated his/her understanding and acceptance.     Plan Discussed with: CRNA and Surgeon  Anesthesia Plan Comments:         Anesthesia Quick Evaluation

## 2013-11-08 ENCOUNTER — Encounter (HOSPITAL_BASED_OUTPATIENT_CLINIC_OR_DEPARTMENT_OTHER): Payer: Self-pay | Admitting: Orthopedic Surgery

## 2013-11-23 ENCOUNTER — Ambulatory Visit: Payer: Self-pay | Attending: Orthopedic Surgery | Admitting: Occupational Therapy

## 2013-11-23 DIAGNOSIS — Z9889 Other specified postprocedural states: Secondary | ICD-10-CM | POA: Insufficient documentation

## 2013-11-23 DIAGNOSIS — M25549 Pain in joints of unspecified hand: Secondary | ICD-10-CM | POA: Insufficient documentation

## 2013-11-23 DIAGNOSIS — IMO0001 Reserved for inherently not codable concepts without codable children: Secondary | ICD-10-CM | POA: Insufficient documentation

## 2013-11-23 DIAGNOSIS — M25649 Stiffness of unspecified hand, not elsewhere classified: Secondary | ICD-10-CM | POA: Insufficient documentation

## 2013-12-02 ENCOUNTER — Ambulatory Visit: Payer: Self-pay | Admitting: Occupational Therapy

## 2015-09-25 IMAGING — CR DG FINGER THUMB 2+V*L*
3 series · 3 of 3 positions shown · non-contrast
Comparison: None.

CLINICAL DATA: Left thumb laceration.

EXAM:
LEFT THUMB 2+V

[x finger obl. left]
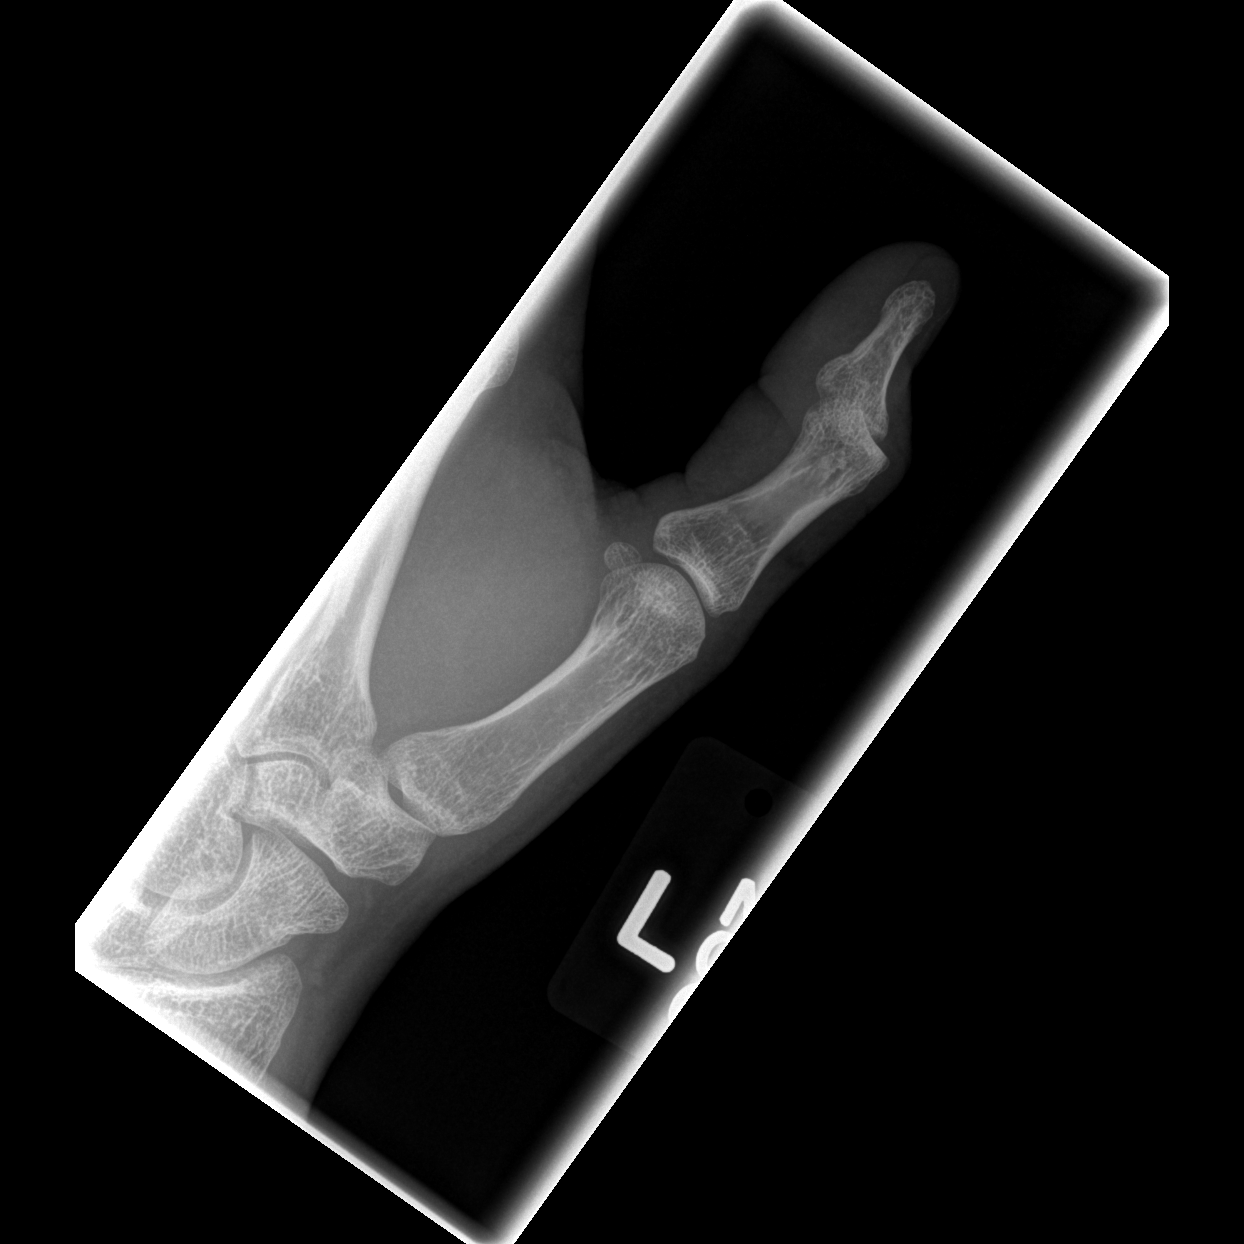

[x finger lateral left]
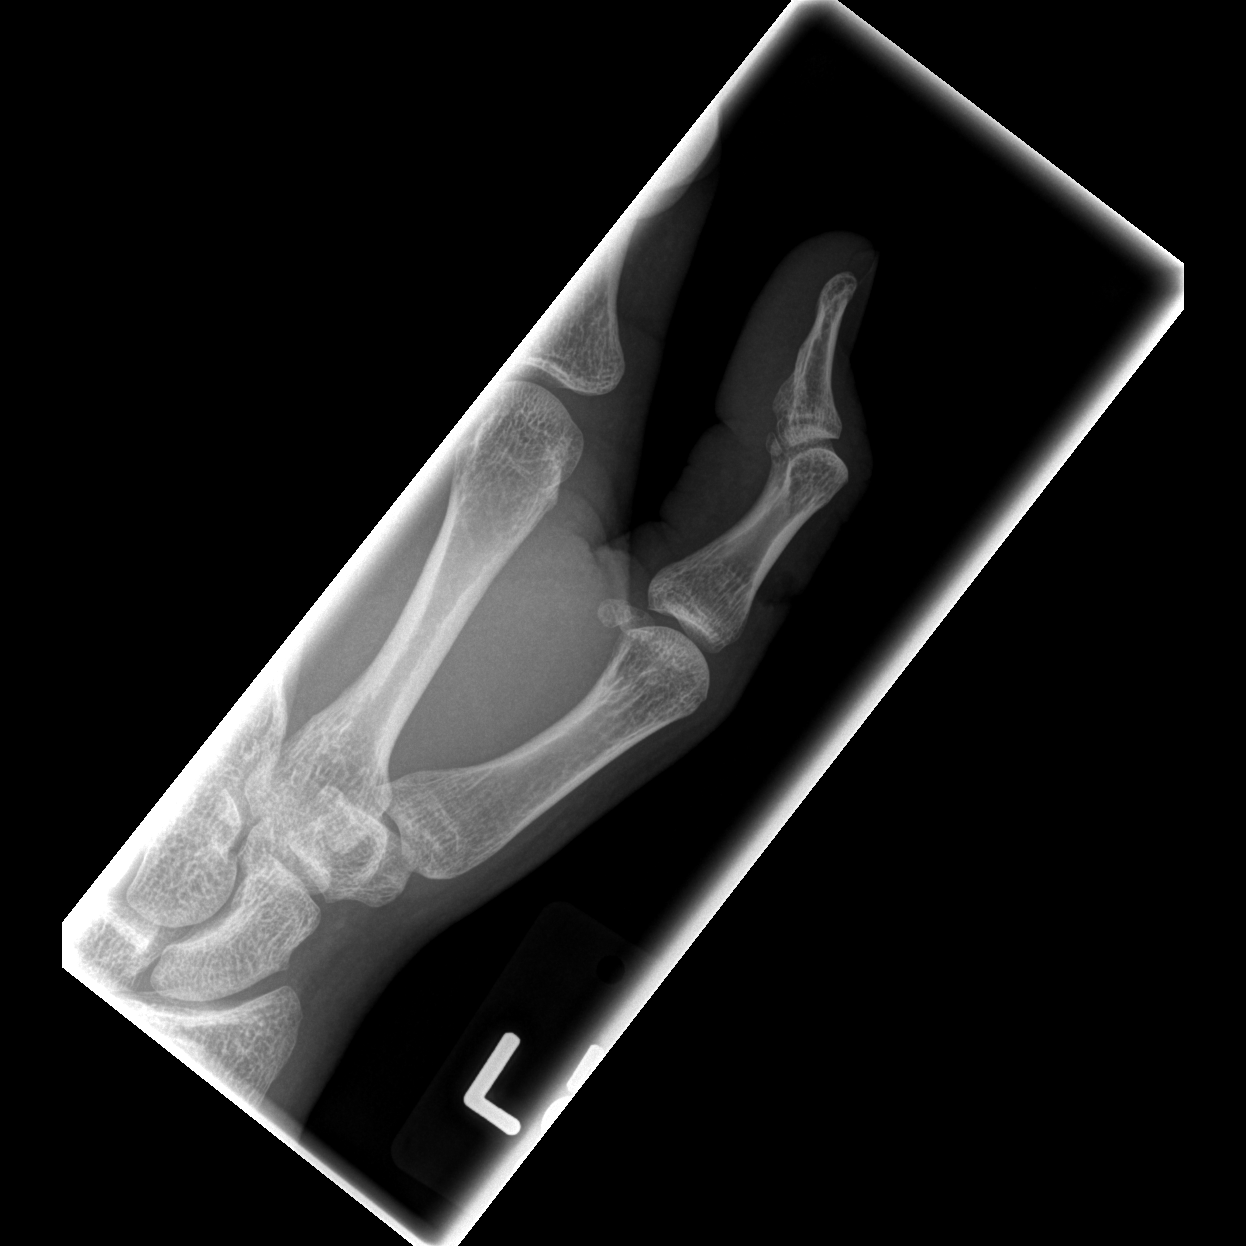

[x finger pa left]
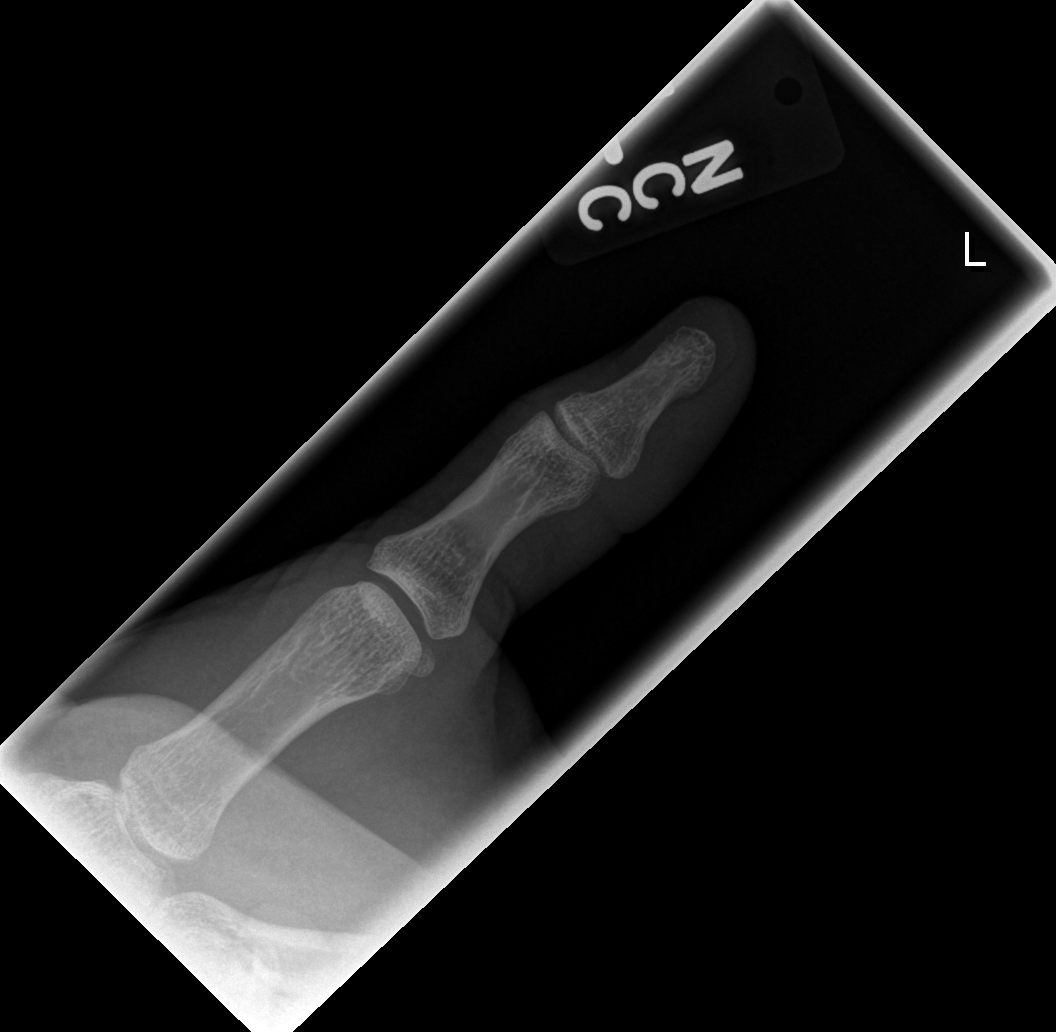

[3 of 3 positions shown; findings below may reference images not displayed]

FINDINGS: Soft tissue defect near the base of the left thumb. No underlying
bony abnormality. No fracture, subluxation or dislocation. No
radiopaque foreign bodies. Joint spaces are maintained.
IMPRESSION: No acute bony abnormality.
